# Patient Record
Sex: Female | Born: 1995 | Race: Black or African American | Hispanic: No | Marital: Single | Smoking: Never smoker
Health system: Southern US, Community
[De-identification: ages and names within clinical notes are randomized; demographics above are authoritative.]

## PROBLEM LIST (undated history)

## (undated) HISTORY — PX: TONSILLECTOMY: SUR1361

---

## 2017-12-07 ENCOUNTER — Emergency Department
Admission: EM | Admit: 2017-12-07 | Discharge: 2017-12-07 | Disposition: A | Discharge status: Home / Self Care | Payer: BLUE CROSS/BLUE SHIELD | Source: Home / Self Care | Attending: Family Medicine | Admitting: Family Medicine

## 2017-12-07 ENCOUNTER — Other Ambulatory Visit: Payer: Self-pay

## 2017-12-07 ENCOUNTER — Encounter: Payer: Self-pay | Admitting: Emergency Medicine

## 2017-12-07 ENCOUNTER — Emergency Department: Payer: BLUE CROSS/BLUE SHIELD

## 2017-12-07 DIAGNOSIS — M79662 Pain in left lower leg: Secondary | ICD-10-CM | POA: Diagnosis not present

## 2017-12-07 DIAGNOSIS — M7989 Other specified soft tissue disorders: Secondary | ICD-10-CM

## 2017-12-07 MED ORDER — PREDNISONE 20 MG PO TABS: ORAL_TABLET | ORAL | 0 refills | Status: DC

## 2017-12-07 MED ORDER — DOXYCYCLINE HYCLATE 100 MG PO CAPS: 100.0000 mg | ORAL_CAPSULE | Freq: Two times a day (BID) | ORAL | 0 refills | Status: DC

## 2017-12-07 NOTE — ED Provider Notes (Signed)
[facility]    CSN: [id number] Arrival date & time: 12/07/17  1154     History   Chief Complaint Chief Complaint  Patient presents with  . Joint Swelling    left    HPI Tiffany Joseph is a 22 y.o. female.   Patient stands at work, and reports that she worked a 16 hour shift 48 hours ago.  She noticed increased swelling, pain, and redness in her left medial ankle afterwards.  Yesterday she had persistent pain/swelling that extended to her left distal calf.  No chest pain or shortness of breath.  No fevers, chills, and sweats.  She recalls no injury or insect bite.  No history of DVT.  Her symptoms did not improve after taking ibuprofen.  She reports that she had cellulitis in her right foot in the past that seemed similar to her present symptoms.  She has a family history of gout (grandmother).  The history is provided by the patient.  Leg Pain  Location:  Ankle and leg Time since incident:  2 days Injury: no   Leg location:  L lower leg Ankle location:  L ankle Pain details:    Quality:  Aching   Radiates to:  Does not radiate   Severity:  Moderate   Onset quality:  Sudden   Duration:  2 days   Timing:  Constant   Progression:  Improving Chronicity:  New Prior injury to area:  No Relieved by:  Nothing Worsened by:  Bearing weight Ineffective treatments:  NSAIDs Associated symptoms: stiffness and swelling   Associated symptoms: no decreased ROM, no fatigue, no fever and no tingling     History reviewed. No pertinent past medical history.  There are no active problems to display for this patient.   Past Surgical History:  Procedure Laterality Date  . TONSILLECTOMY      OB History   None      Home Medications    Prior to Admission medications   Medication Sig Start Date End Date Taking? Authorizing Provider  doxycycline (VIBRAMYCIN) 100 MG capsule Take 1 capsule (100 mg total) by mouth 2 (two) times daily. Take with food. 12/07/17   Lattie HawBeese,  Gizella [place] A, MD  predniSONE (DELTASONE) 20 MG tablet Take one tab by mouth twice daily for 4 days, then one daily. Take with food. 12/07/17   Lattie HawBeese, Roshunda Keir A, MD    Family History No family history on file.  Social History Social History   Tobacco Use  . Smoking status: Never Smoker  . Smokeless tobacco: Never Used  Substance Use Topics  . Alcohol use: Never    Frequency: Never  . Drug use: Not on file     Allergies   Penicillins   Review of Systems Review of Systems  Constitutional: Negative for fatigue and fever.  Musculoskeletal: Positive for stiffness.  All other systems reviewed and are negative.    Physical Exam Triage Vital Signs ED Triage Vitals  Enc Vitals Group     BP 12/07/17 1230 108/71     Pulse Rate 12/07/17 1230 91     Resp 12/07/17 1230 16     Temp 12/07/17 1230 99 F (37.2 C)     Temp Source 12/07/17 1230 Oral     SpO2 12/07/17 1230 100 %     Weight 12/07/17 1231 158 lb (71.7 kg)     Height 12/07/17 1231 5\' 3"  (1.6 m)     Head Circumference --  Peak Flow --      Pain Score 12/07/17 1231 3     Pain Loc --      Pain Edu? --      Excl. in [place]? --    No data found.  Updated Vital Signs BP 108/71 (BP Location: Right Arm)   Pulse 91   Temp 99 F (37.2 C) (Oral)   Resp 16   Ht 5\' 3"  (1.6 m)   Wt 158 lb (71.7 kg)   LMP 11/14/2017 (Exact Date)   SpO2 100%   BMI 27.99 kg/m   Visual Acuity Right Eye Distance:   Left Eye Distance:   Bilateral Distance:    Right Eye Near:   Left Eye Near:    Bilateral Near:     Physical Exam  Constitutional: She appears well-developed and well-nourished. No distress.  HENT:  Head: Normocephalic.  Mouth/Throat: Oropharynx is clear and moist.  Eyes: Pupils are equal, round, and reactive to light.  Neck: Neck supple.  Cardiovascular: Normal heart sounds.  Pulmonary/Chest: Breath sounds normal.  Abdominal: There is no tenderness.  Musculoskeletal:       Left ankle: She exhibits swelling. She  exhibits normal range of motion, no ecchymosis, no deformity, no laceration and normal pulse. Tenderness.       Feet:  Left ankle has tenderness, mild swelling, and erythema above the medial malleolus as noted on diagram.  Tenderness extends to the distal calf.  Ankle has full range of motion.  Pedal pulses intact.  Lymphadenopathy:    She has no cervical adenopathy.  Nursing note and vitals reviewed.    UC Treatments / Results  Labs (all labs ordered are listed, but only abnormal results are displayed) Labs Reviewed  URIC ACID  POCT CBC W AUTO DIFF ([facility]):  WBC 7.6; LY 34.1; MO 9.7; GR 56.2; Hgb 12.3; Platelets 224     EKG None  Radiology US Venous Img Lower Unilateral Left  Result Date: 12/07/2017 CLINICAL DATA:  Medial sided ankle pain and swelling for the past 2 days. Evaluate for DVT. EXAM: LEFT LOWER EXTREMITY VENOUS DOPPLER ULTRASOUND TECHNIQUE: Gray-scale sonography with graded compression, as well as color Doppler and duplex ultrasound were performed to evaluate the lower extremity deep venous systems from the level of the common femoral vein and including the common femoral, femoral, profunda femoral, popliteal and calf veins including the posterior tibial, peroneal and gastrocnemius veins when visible. The superficial great saphenous vein was also interrogated. Spectral Doppler was utilized to evaluate flow at rest and with distal augmentation maneuvers in the common femoral, femoral and popliteal veins. COMPARISON:  None. FINDINGS: Contralateral Common Femoral Vein: Respiratory phasicity is normal and symmetric with the symptomatic side. No evidence of thrombus. Normal compressibility. Common Femoral Vein: No evidence of thrombus. Normal compressibility, respiratory phasicity and response to augmentation. Saphenofemoral Junction: No evidence of thrombus. Normal compressibility and flow on color Doppler imaging. Profunda Femoral Vein: No evidence of thrombus. Normal  compressibility and flow on color Doppler imaging. Femoral Vein: No evidence of thrombus. Normal compressibility, respiratory phasicity and response to augmentation. Popliteal Vein: No evidence of thrombus. Normal compressibility, respiratory phasicity and response to augmentation. Calf Veins: No evidence of thrombus. Normal compressibility and flow on color Doppler imaging. Superficial Great Saphenous Vein: No evidence of thrombus. Normal compressibility. Venous Reflux:  None. Other Findings:  None. IMPRESSION: No evidence of DVT within the left lower extremity. Electronically Signed   By: Simonne Come M.D.   On: 12/07/2017  14:28    Procedures Procedures (including critical care time)  Medications Ordered in UC Medications - No data to display  Initial Impression / Assessment and Plan / UC Course  I have reviewed the triage vital signs and the nursing notes.  Pertinent labs & imaging results that were available during my care of the patient were reviewed by me and considered in my medical decision making (see chart for details).    No evidence DVT.  Note normal WBC (7.6) ?gout vs cellulitis. Uric acid pending. Begin empiric doxycycline and prednisone burst/taper. Followup with Family Doctor if not improved in one week.     Final Clinical Impressions(s) / UC Diagnoses   Final diagnoses:  Pain and swelling of left lower leg   Discharge Instructions   None    ED Prescriptions    Medication Sig Dispense Auth. Provider   doxycycline (VIBRAMYCIN) 100 MG capsule Take 1 capsule (100 mg total) by mouth 2 (two) times daily. Take with food. 14 capsule Lattie Haw, MD   predniSONE (DELTASONE) 20 MG tablet Take one tab by mouth twice daily for 4 days, then one daily. Take with food. 12 tablet Lattie Haw, MD         Lattie Haw, MD 12/09/17 [phone]

## 2017-12-07 NOTE — ED Triage Notes (Signed)
Patient worked long hours over past 48 hours and noticed progressive edema and discomfort on inner aspect of left edema; no known injury or insect bite; tried ice/elevation and anti-inflammatories.

## 2017-12-08 ENCOUNTER — Telehealth: Payer: Self-pay

## 2017-12-08 LAB — URIC ACID: Uric Acid, Serum: 4.4 mg/dL (ref 2.5–7.0)

## 2017-12-08 NOTE — Telephone Encounter (Signed)
Spoke with patient, feeling better.  She asked if not feeling better by Saturday, could she have a work note, I explained that we could give a note for date seen, but not for 6 days after being seen.  Lab results given

## 2017-12-28 LAB — POCT CBC W AUTO DIFF (K'VILLE URGENT CARE)

## 2019-02-26 ENCOUNTER — Other Ambulatory Visit: Payer: Self-pay

## 2019-02-26 DIAGNOSIS — Z20822 Contact with and (suspected) exposure to covid-19: Secondary | ICD-10-CM

## 2019-02-27 LAB — NOVEL CORONAVIRUS, NAA: SARS-CoV-2, NAA: NOT DETECTED

## 2019-05-02 IMAGING — US US EXTREM LOW VENOUS*L*
1 series · 13 of 24 positions shown · non-contrast
Comparison: None.

CLINICAL DATA: Medial sided ankle pain and swelling for the past 2
days. Evaluate for DVT.



[Series 1: us extrem low venous*left* · 13 of 28 slices shown]
[im 1/28]
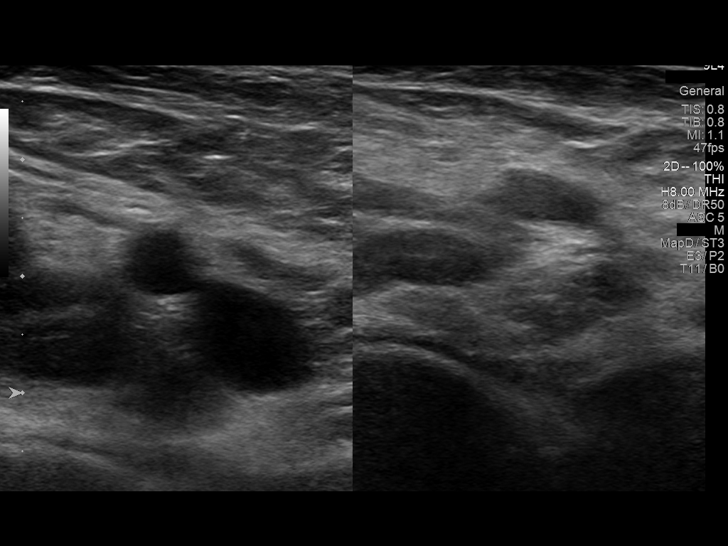
[im 3/28]
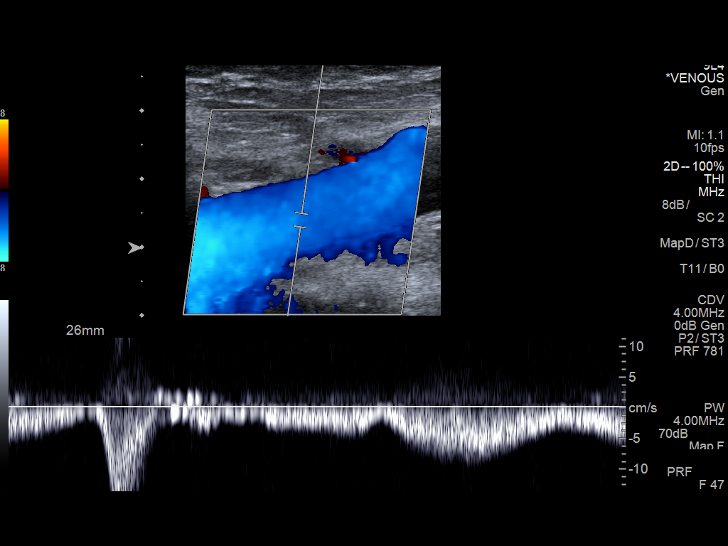
[im 5/28]
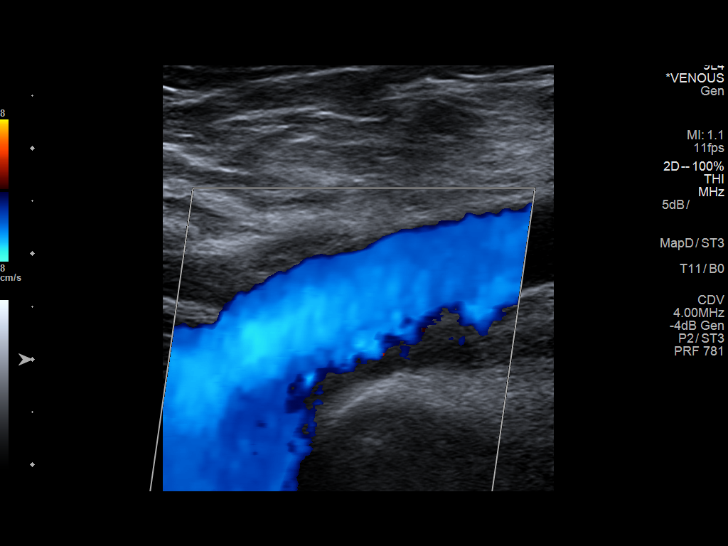
[im 8/28]
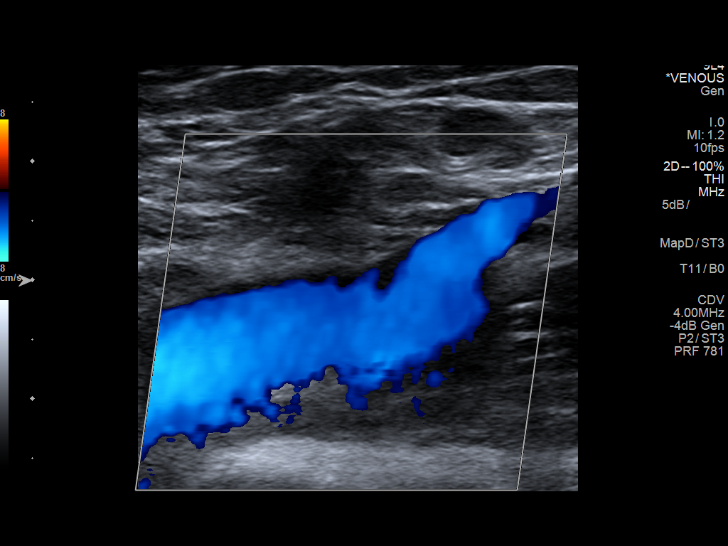
[im 10/28]
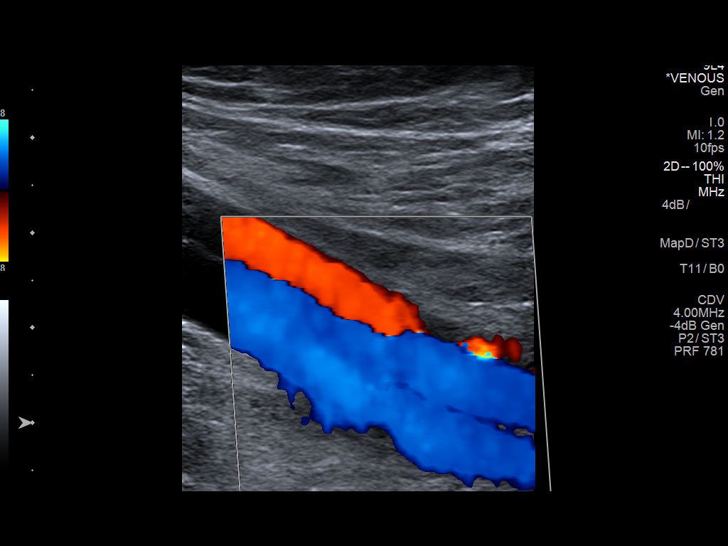
[im 12/28]
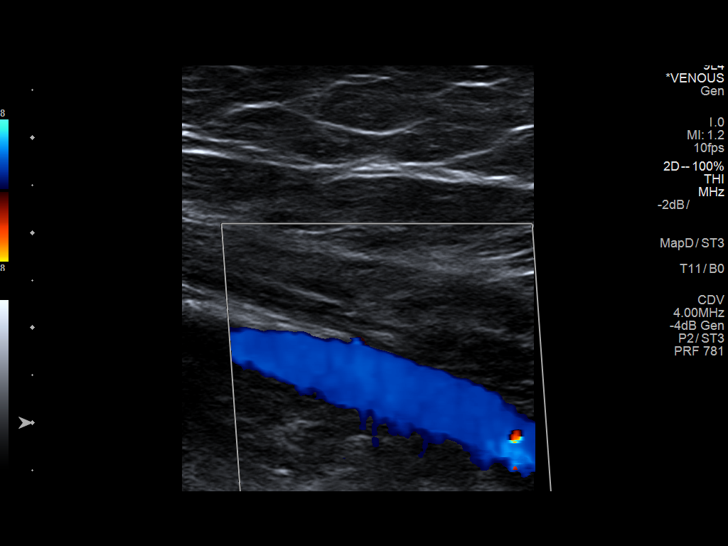
[im 15/28]
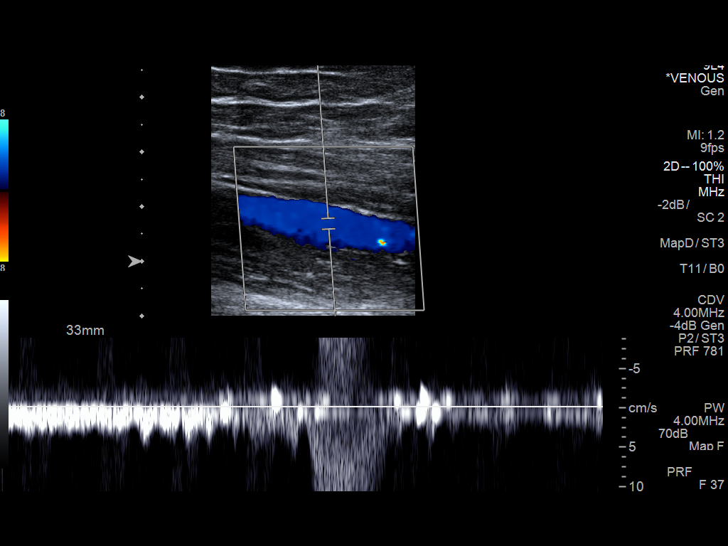
[im 16/28]
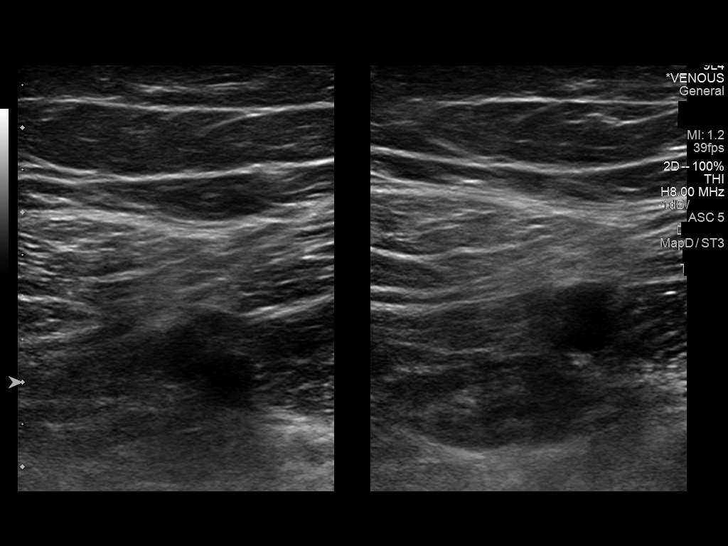
[im 18/28]
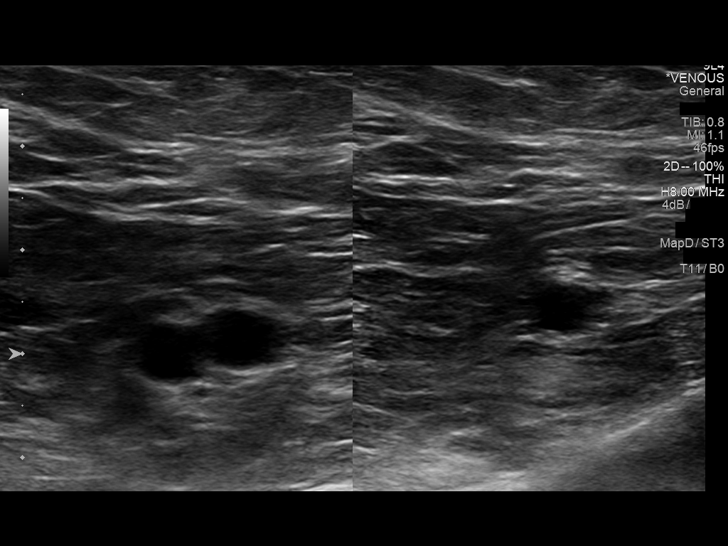
[im 20/28]
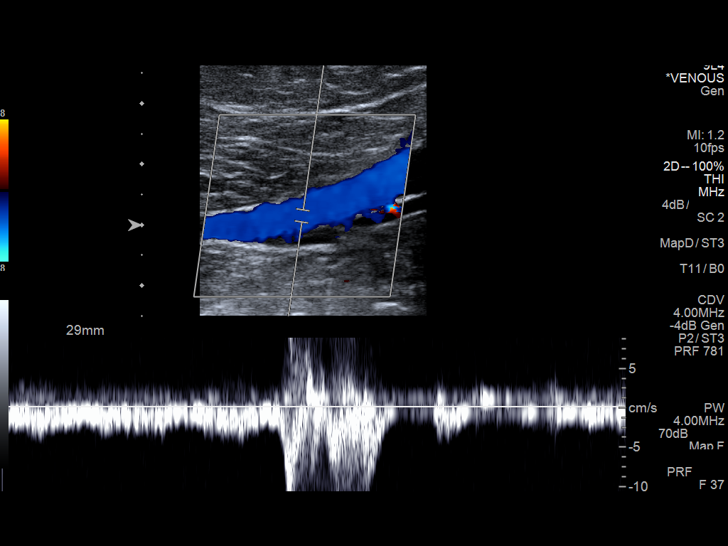
[im 23/28]
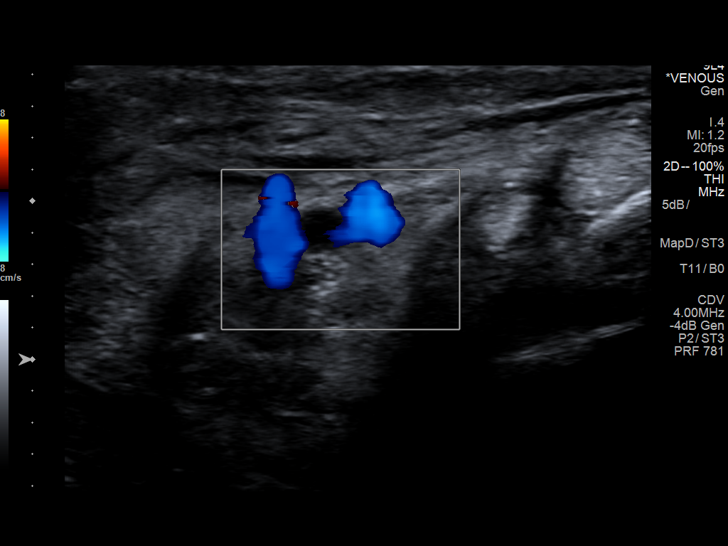
[im 25/28]
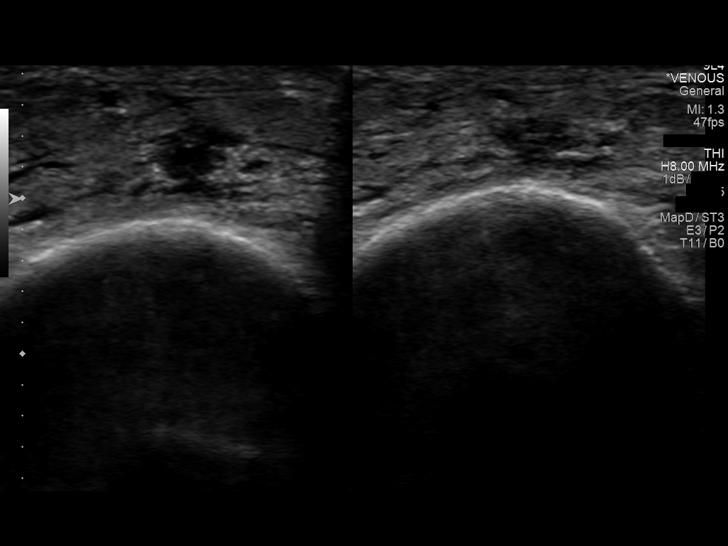
[im 28/28]
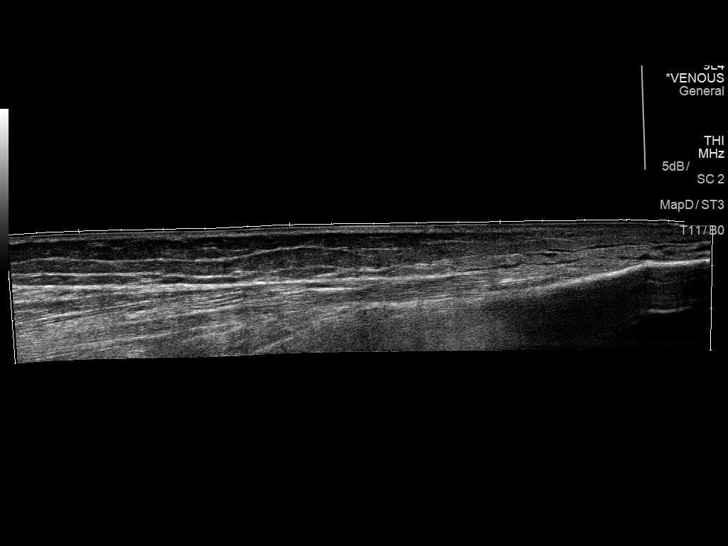

[13 of 24 positions shown; findings below may reference images not displayed]

FINDINGS: Contralateral Common Femoral Vein: Respiratory phasicity is normal
and symmetric with the symptomatic side. No evidence of thrombus.
Normal compressibility.

Common Femoral Vein: No evidence of thrombus. Normal
compressibility, respiratory phasicity and response to augmentation.

Saphenofemoral Junction: No evidence of thrombus. Normal
compressibility and flow on color Doppler imaging.

Profunda Femoral Vein: No evidence of thrombus. Normal
compressibility and flow on color Doppler imaging.

Femoral Vein: No evidence of thrombus. Normal compressibility,
respiratory phasicity and response to augmentation.

Popliteal Vein: No evidence of thrombus. Normal compressibility,
respiratory phasicity and response to augmentation.

Calf Veins: No evidence of thrombus. Normal compressibility and flow
on color Doppler imaging.

Superficial Great Saphenous Vein: No evidence of thrombus. Normal
compressibility.

Venous Reflux:  None.

Other Findings:  None.
IMPRESSION: No evidence of DVT within the left lower extremity.

## 2019-12-25 ENCOUNTER — Ambulatory Visit (HOSPITAL_COMMUNITY)
Admission: EM | Admit: 2019-12-25 | Discharge: 2019-12-25 | Disposition: A | Discharge status: Home / Self Care | Attending: Family Medicine | Admitting: Family Medicine

## 2019-12-25 ENCOUNTER — Encounter (HOSPITAL_COMMUNITY): Payer: Self-pay

## 2019-12-25 ENCOUNTER — Other Ambulatory Visit: Payer: Self-pay

## 2019-12-25 DIAGNOSIS — N898 Other specified noninflammatory disorders of vagina: Secondary | ICD-10-CM | POA: Diagnosis present

## 2019-12-25 DIAGNOSIS — Z3202 Encounter for pregnancy test, result negative: Secondary | ICD-10-CM

## 2019-12-25 LAB — POC URINE PREG, ED: Preg Test, Ur: NEGATIVE

## 2019-12-25 MED ORDER — FLUCONAZOLE 150 MG PO TABS: 150.0000 mg | ORAL_TABLET | Freq: Once | ORAL | 0 refills | Status: AC

## 2019-12-25 NOTE — Discharge Instructions (Signed)
Take 1 tab diflucan, repeat in 3-4 days if yeast positive and still having symptoms  We are testing you for Gonorrhea, Chlamydia, Trichomonas, Yeast and Bacterial Vaginosis. We will call you if anything is positive and let you know if you require any further treatment. Please inform partners of any positive results.   Please return if symptoms not improving with treatment, development of fever, nausea, vomiting, abdominal pain.

## 2019-12-25 NOTE — ED Notes (Signed)
Negative pregnancy test

## 2019-12-25 NOTE — ED Triage Notes (Signed)
Pt c/o a large amount of white, thick cottage cheese dischargex4 days. Pt denies any other symptoms.

## 2019-12-25 NOTE — ED Provider Notes (Signed)
[facility]    CSN: [id number] Arrival date & time: 12/25/19  1006      History   Chief Complaint Chief Complaint  Patient presents with  . vaginal discharge    HPI Tiffany Joseph is a 24 y.o. female presenting today for evaluation of vaginal discharge.  Patient reports that over the past 4 days she has had increased amount of discharge which she reports is thicker than normal.  She denies other symptoms of itching irritation or burning.  Denies urinary symptoms of dysuria, increased frequency urgency, hematuria.  Denies any pelvic pain.  Last menstrual cycle was around 06/1.  Is not on any form of birth control.  Does report history of yeast and BV, but more significantly yeast.  Typically will have itching associated with the symptoms.  HPI  History reviewed. No pertinent past medical history.  There are no problems to display for this patient.   Past Surgical History:  Procedure Laterality Date  . TONSILLECTOMY      OB History   No obstetric history on file.      Home Medications    Prior to Admission medications   Medication Sig Start Date End Date Taking? Authorizing Provider  fluconazole (DIFLUCAN) 150 MG tablet Take 1 tablet (150 mg total) by mouth once for 1 dose. 12/25/19 12/25/19  Jermiya Reichl, Elesa Hacker, PA-C    Family History No family history on file.  Social History Social History   Tobacco Use  . Smoking status: Never Smoker  . Smokeless tobacco: Never Used  Substance Use Topics  . Alcohol use: Never  . Drug use: Never     Allergies   Penicillins   Review of Systems Review of Systems  Constitutional: Negative for fever.  Respiratory: Negative for shortness of breath.   Cardiovascular: Negative for chest pain.  Gastrointestinal: Negative for abdominal pain, diarrhea, nausea and vomiting.  Genitourinary: Positive for vaginal discharge. Negative for dysuria, flank pain, genital sores, hematuria, menstrual problem, vaginal bleeding  and vaginal pain.  Musculoskeletal: Negative for back pain.  Skin: Negative for rash.  Neurological: Negative for dizziness, light-headedness and headaches.     Physical Exam Triage Vital Signs ED Triage Vitals  Enc Vitals Group     BP      Pulse      Resp      Temp      Temp src      SpO2      Weight      Height      Head Circumference      Peak Flow      Pain Score      Pain Loc      Pain Edu?      Excl. in [place]?    No data found.  Updated Vital Signs BP 111/69   Pulse 76   Temp 98.6 F (37 C) (Oral)   Resp 16   Ht 5\' 4"  (1.626 m)   Wt 140 lb (63.5 kg)   BMI 24.03 kg/m   Visual Acuity Right Eye Distance:   Left Eye Distance:   Bilateral Distance:    Right Eye Near:   Left Eye Near:    Bilateral Near:     Physical Exam Vitals and nursing note reviewed.  Constitutional:      Appearance: She is well-developed.     Comments: No acute distress  HENT:     Head: Normocephalic and atraumatic.     Nose: Nose normal.  Eyes:  Conjunctiva/sclera: Conjunctivae normal.  Cardiovascular:     Rate and Rhythm: Normal rate.  Pulmonary:     Effort: Pulmonary effort is normal. No respiratory distress.  Abdominal:     General: There is no distension.  Musculoskeletal:        General: Normal range of motion.     Cervical back: Neck supple.  Skin:    General: Skin is warm and dry.  Neurological:     Mental Status: She is alert and oriented to person, place, and time.      UC Treatments / Results  Labs (all labs ordered are listed, but only abnormal results are displayed) Labs Reviewed  POC URINE PREG, ED  CERVICOVAGINAL ANCILLARY ONLY    EKG   Radiology No results found.  Procedures Procedures (including critical care time)  Medications Ordered in UC Medications - No data to display  Initial Impression / Assessment and Plan / UC Course  I have reviewed the triage vital signs and the nursing notes.  Pertinent labs & imaging results that were  available during my care of the patient were reviewed by me and considered in my medical decision making (see chart for details).     Empirically treating for yeast with Diflucan, 1 tab today, 1 tab in 2 to 3 days if still persistent symptoms.  Vaginal swab pending to evaluate for BV/yeast/STDs.  Will call with results and alter treatment as needed.  Pregnancy test negative.  Discussed strict return precautions. Patient verbalized understanding and is agreeable with plan.  Final Clinical Impressions(s) / UC Diagnoses   Final diagnoses:  Vaginal discharge     Discharge Instructions     Take 1 tab diflucan, repeat in 3-4 days if yeast positive and still having symptoms  We are testing you for Gonorrhea, Chlamydia, Trichomonas, Yeast and Bacterial Vaginosis. We will call you if anything is positive and let you know if you require any further treatment. Please inform partners of any positive results.   Please return if symptoms not improving with treatment, development of fever, nausea, vomiting, abdominal pain.     ED Prescriptions    Medication Sig Dispense Auth. Provider   fluconazole (DIFLUCAN) 150 MG tablet Take 1 tablet (150 mg total) by mouth once for 1 dose. 2 tablet Kaizen Ibsen, Pyote C, PA-C     PDMP not reviewed this encounter.   Lew Dawes, [place] 12/25/19 2216

## 2019-12-26 LAB — CERVICOVAGINAL ANCILLARY ONLY
Bacterial Vaginitis (gardnerella): NEGATIVE
Candida Glabrata: NEGATIVE
Candida Vaginitis: NEGATIVE
Chlamydia: NEGATIVE
Comment: NEGATIVE
Comment: NEGATIVE
Comment: NEGATIVE
Comment: NEGATIVE
Comment: NEGATIVE
Comment: NORMAL
Neisseria Gonorrhea: NEGATIVE
Trichomonas: NEGATIVE

## 2020-10-29 ENCOUNTER — Other Ambulatory Visit: Payer: Self-pay | Admitting: Nurse Practitioner

## 2020-10-29 DIAGNOSIS — N6311 Unspecified lump in the right breast, upper outer quadrant: Secondary | ICD-10-CM

## 2020-11-13 ENCOUNTER — Ambulatory Visit
Admission: RE | Admit: 2020-11-13 | Discharge: 2020-11-13 | Disposition: A | Discharge status: Home / Self Care | Source: Ambulatory Visit | Attending: Nurse Practitioner | Admitting: Nurse Practitioner

## 2020-11-13 ENCOUNTER — Other Ambulatory Visit: Payer: Self-pay | Admitting: Nurse Practitioner

## 2020-11-13 ENCOUNTER — Other Ambulatory Visit: Payer: Self-pay

## 2020-11-13 DIAGNOSIS — N6311 Unspecified lump in the right breast, upper outer quadrant: Secondary | ICD-10-CM

## 2021-05-22 ENCOUNTER — Other Ambulatory Visit: Payer: Self-pay | Admitting: Nurse Practitioner

## 2021-05-22 ENCOUNTER — Ambulatory Visit
Admission: RE | Admit: 2021-05-22 | Discharge: 2021-05-22 | Disposition: A | Discharge status: Home / Self Care | Source: Ambulatory Visit | Attending: Nurse Practitioner | Admitting: Nurse Practitioner

## 2021-05-22 DIAGNOSIS — N6311 Unspecified lump in the right breast, upper outer quadrant: Secondary | ICD-10-CM

## 2021-05-22 DIAGNOSIS — N63 Unspecified lump in unspecified breast: Secondary | ICD-10-CM

## 2021-11-21 ENCOUNTER — Other Ambulatory Visit

## 2021-11-26 IMAGING — US US BREAST*R* LIMITED INC AXILLA
1 series · 14 of 19 positions shown · non-contrast
Comparison: None.

CLINICAL DATA: Palpable lump in the right breast felt by the
patient's physician.

EXAM:
ULTRASOUND OF THE RIGHT BREAST

[Series 1: us breast*right* limited inc axilla · 0.06mm/px · 14 of 19 slices shown]
[im 1/19]
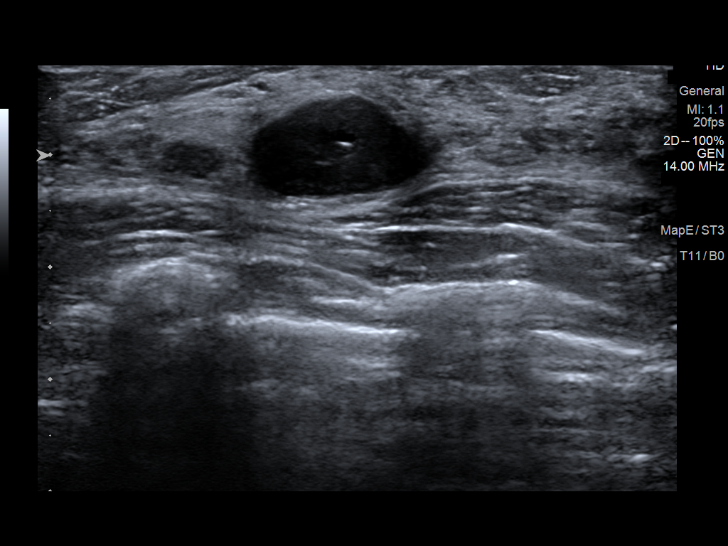
[im 3/19]
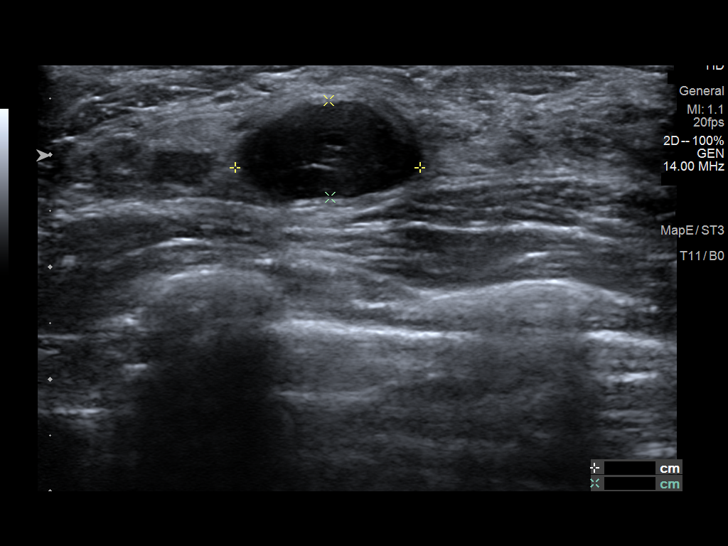
[im 4/19]
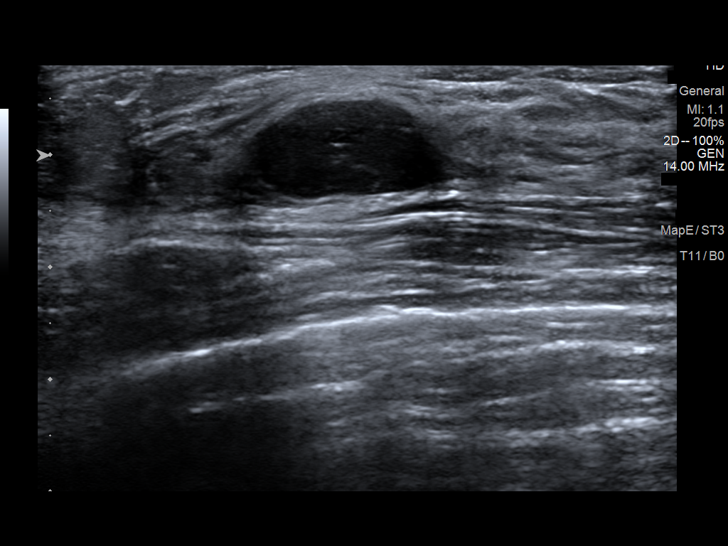
[im 5/19]
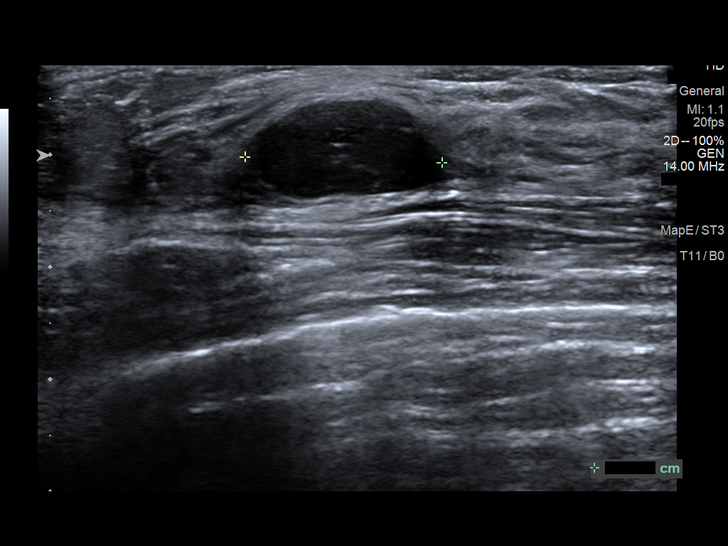
[im 7/19]
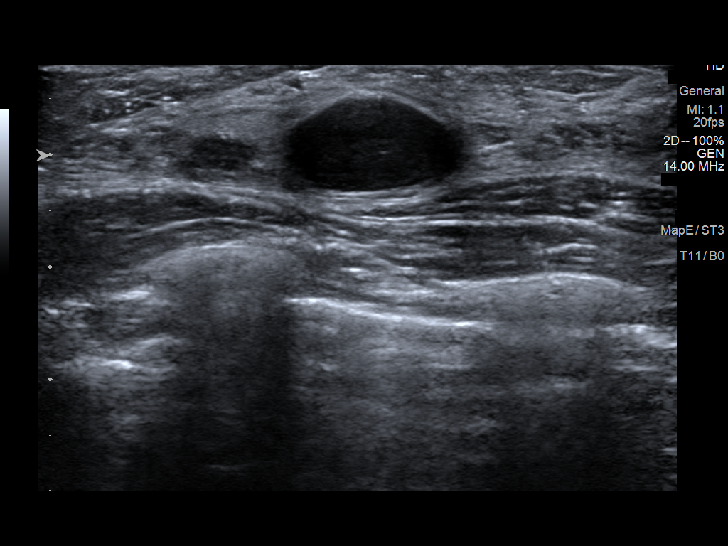
[im 8/19]
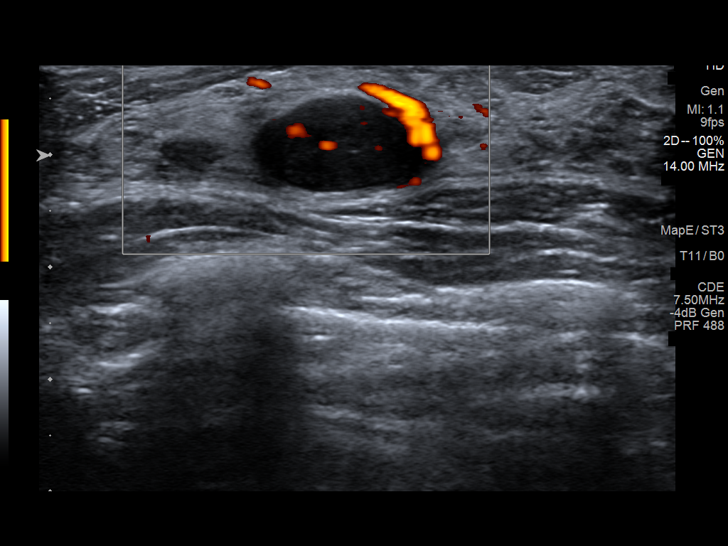
[im 9/19]
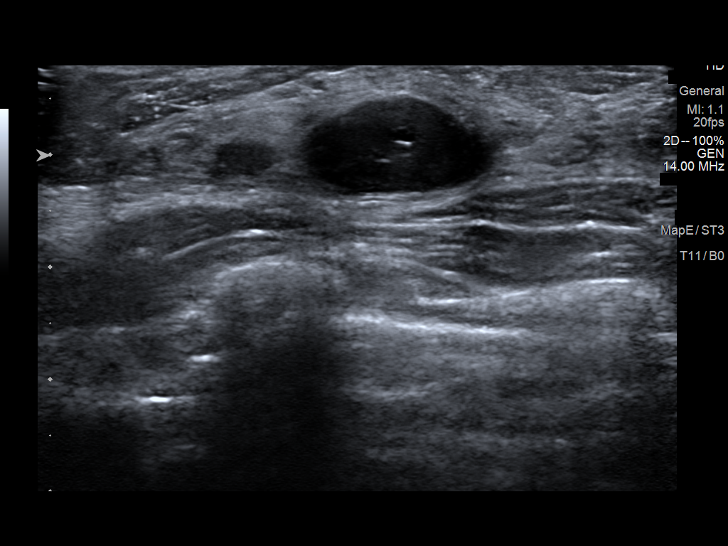
[im 11/19]
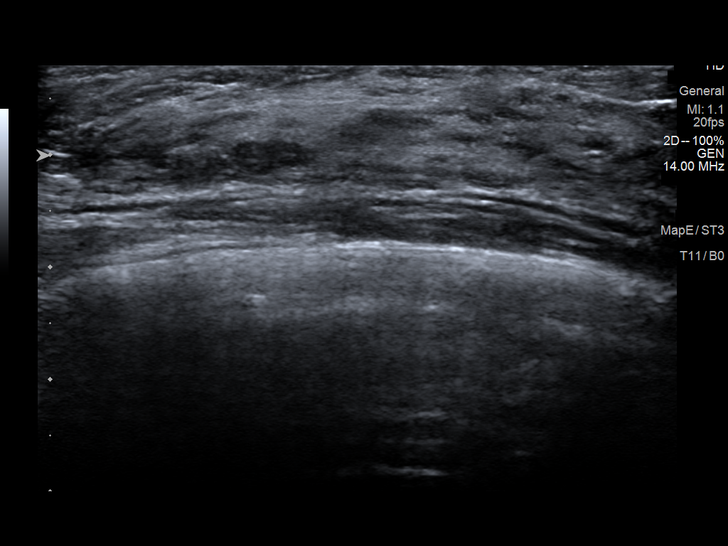
[im 12/19]
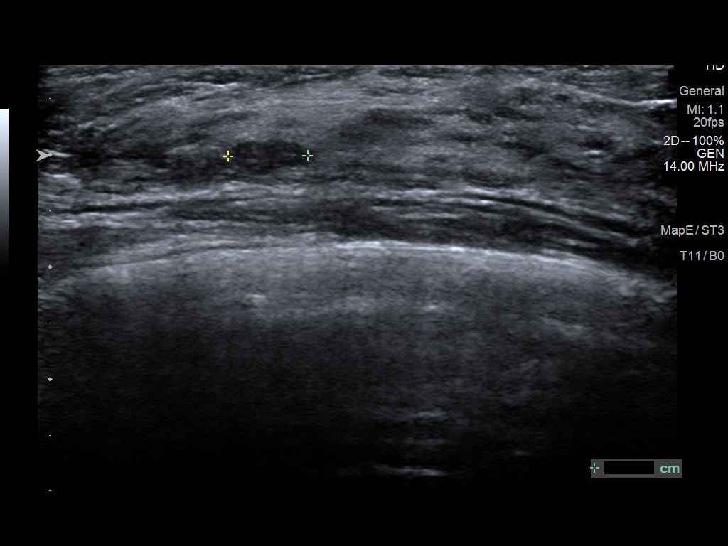
[im 13/19]
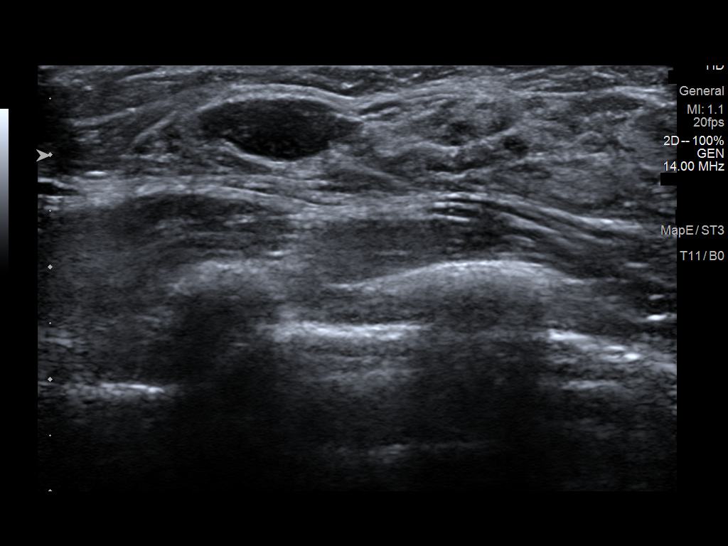
[im 15/19]
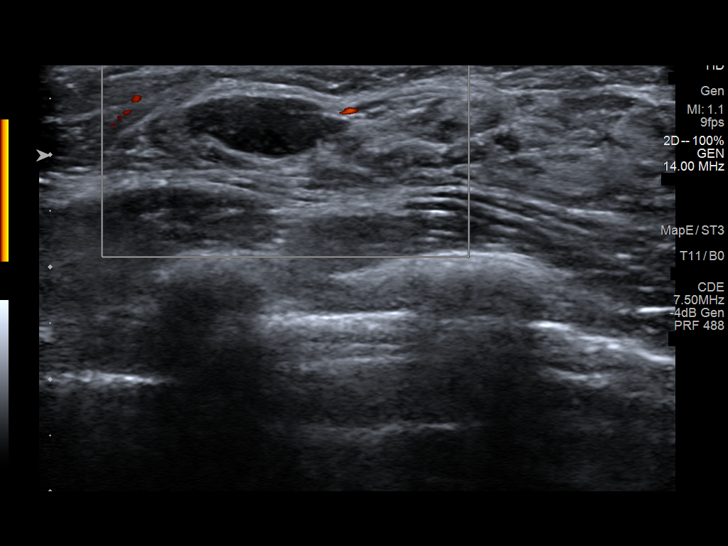
[im 16/19]
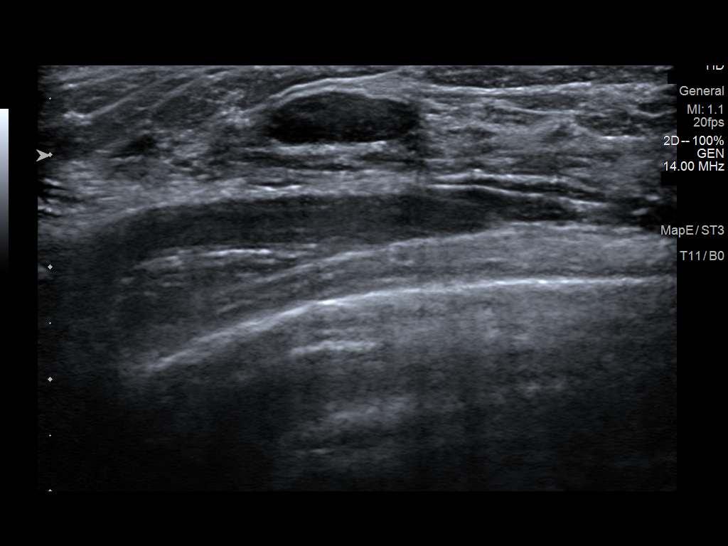
[im 17/19]
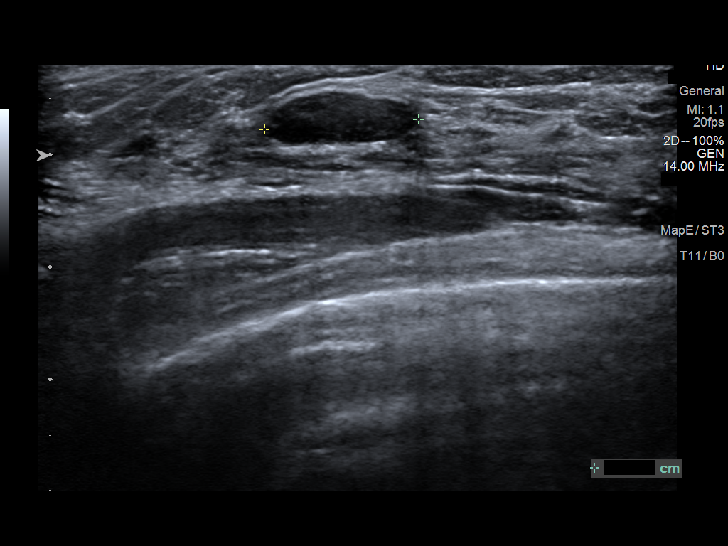
[im 19/19]
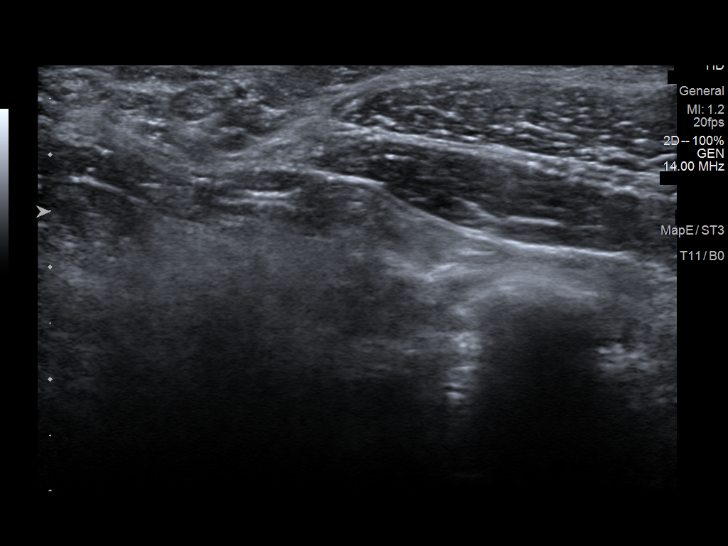

[14 of 19 positions shown; findings below may reference images not displayed]

FINDINGS: On physical exam, palpable lump is felt at 10 o'clock in the right
breast.

Targeted ultrasound is performed, showing a hypoechoic mass with
parallel orientation and increased through transmission in the right
breast at 10 o'clock 4 cm from the nipple measuring 17 x 9 x 18 mm.
There is an adjacent similar appearing mass at 10 o'clock, 4 cm from
the nipple measuring 6 x 3 x 7 mm. A final similar appearing masses
seen at [DATE], 1 cm from the nipple measuring 14 x 6 x 14 mm.
IMPRESSION: Three probably benign masses in the right breast as above.

RECOMMENDATION:
Recommend six-month follow-up ultrasound of the probably benign
right breast masses.

I have discussed the findings and recommendations with the patient.
If applicable, a reminder letter will be sent to the patient
regarding the next appointment.

BI-RADS CATEGORY  3: Probably benign.

## 2022-06-04 IMAGING — US US BREAST*R* LIMITED INC AXILLA
1 series · 14 of 15 positions shown · non-contrast
Comparison: 11/13/2020

CLINICAL DATA: First six-month follow-up for probably benign RIGHT
breast masses.

EXAM:
ULTRASOUND OF THE RIGHT BREAST

[Series 1: us breast*right* limited inc axilla · 0.06mm/px · 14 of 15 slices shown]
[im 1/15]
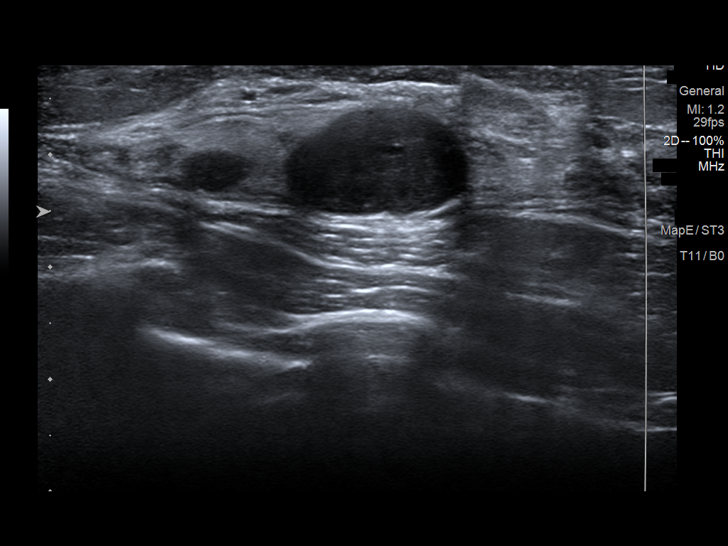
[im 2/15]
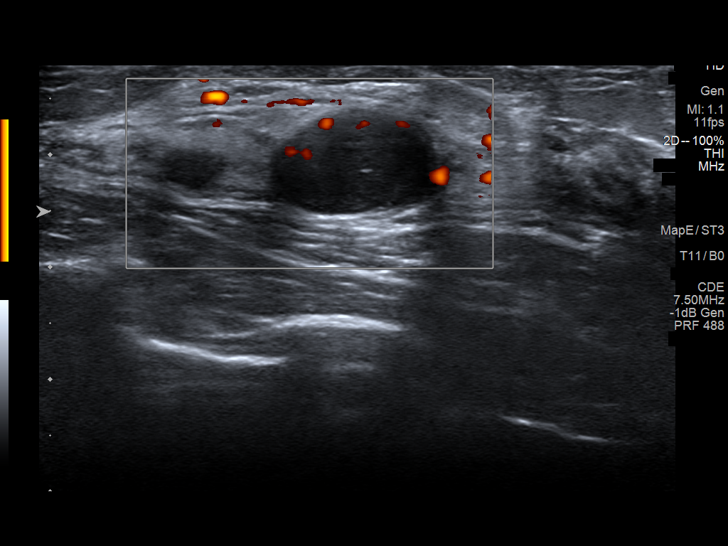
[im 3/15]
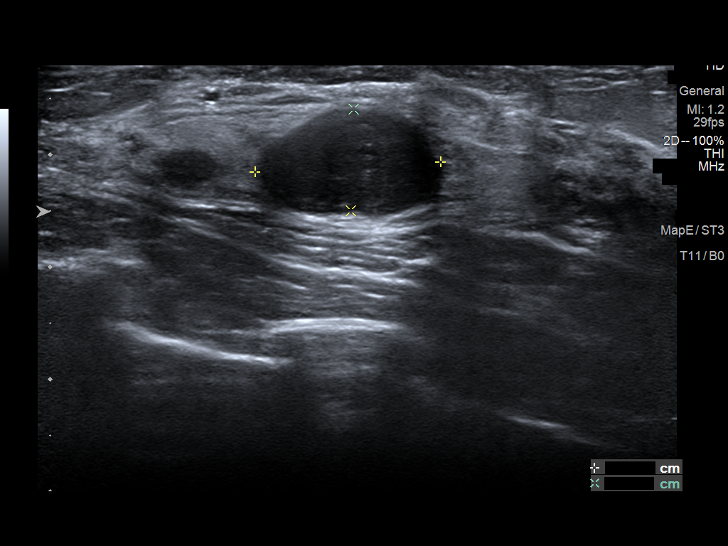
[im 4/15]
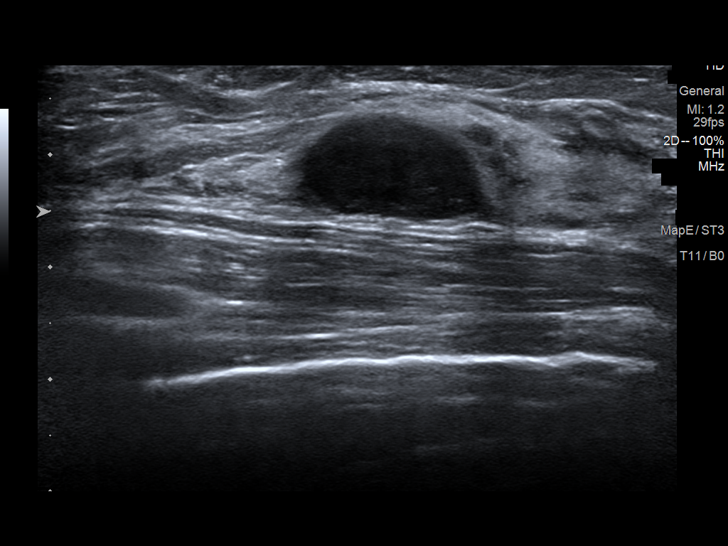
[im 5/15]
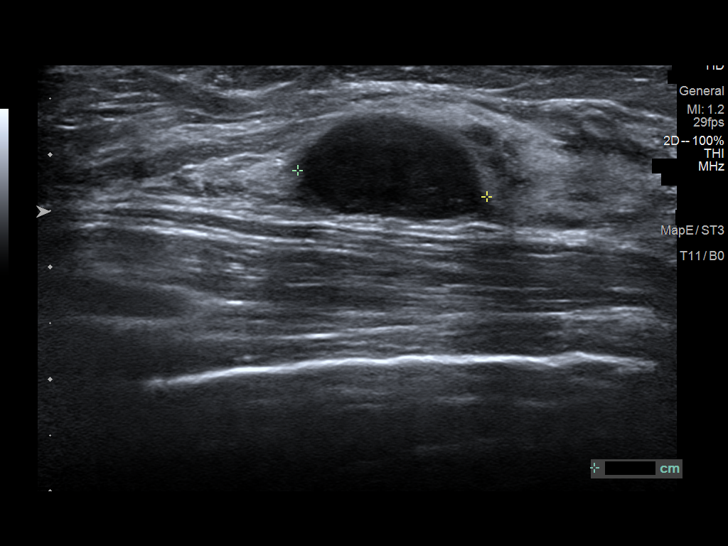
[im 6/15]
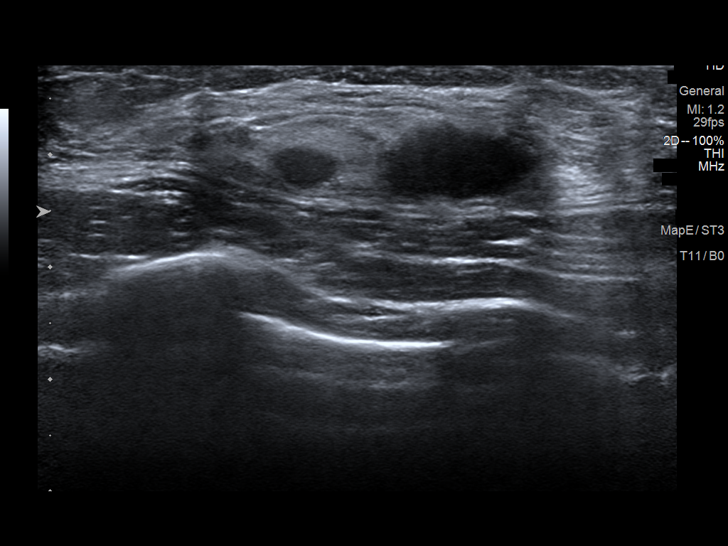
[im 7/15]
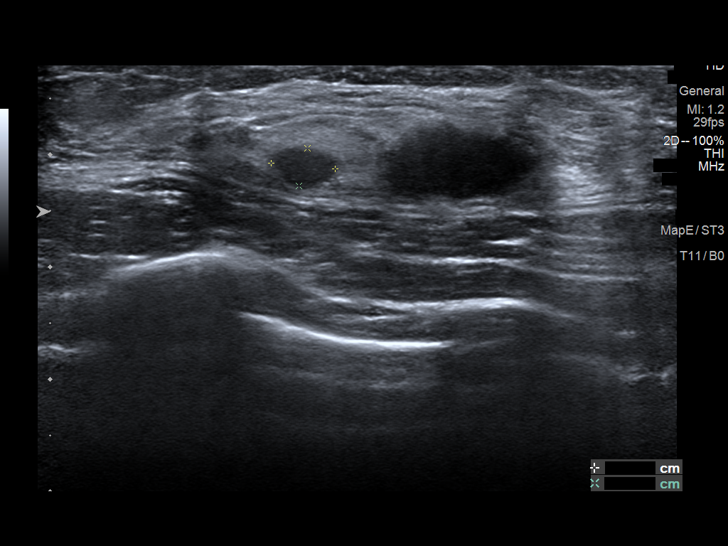
[im 9/15]
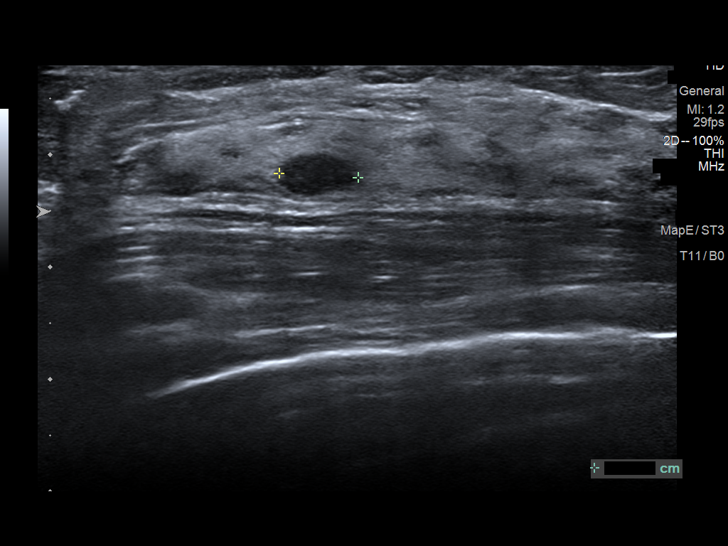
[im 10/15]
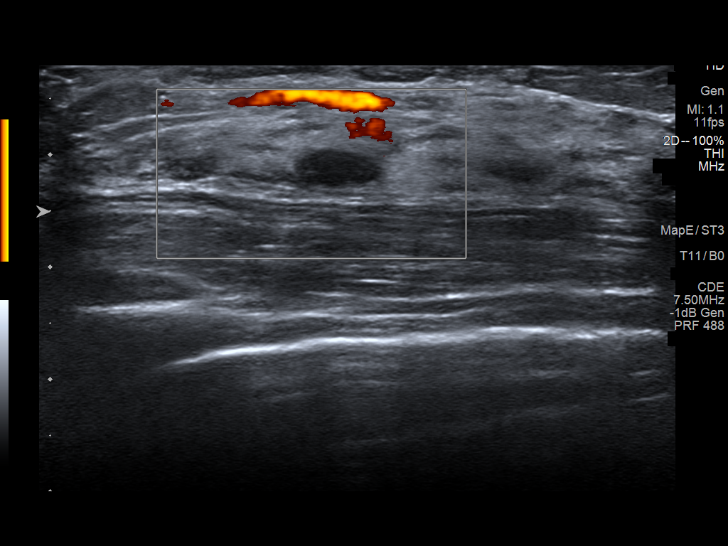
[im 11/15]
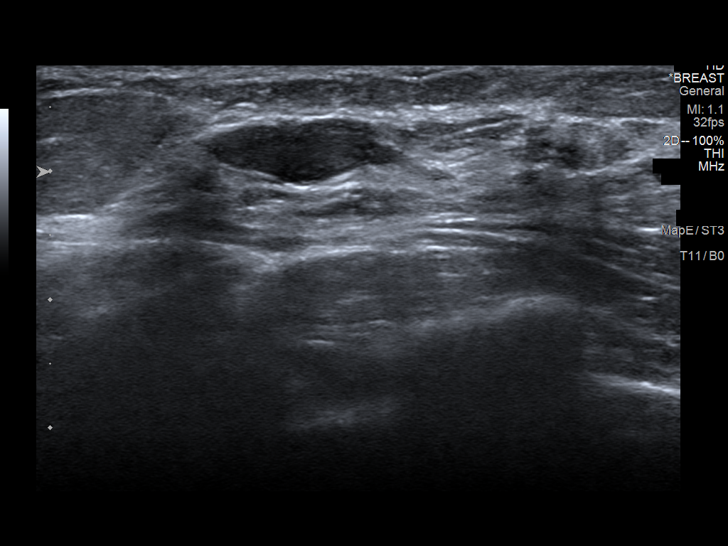
[im 12/15]
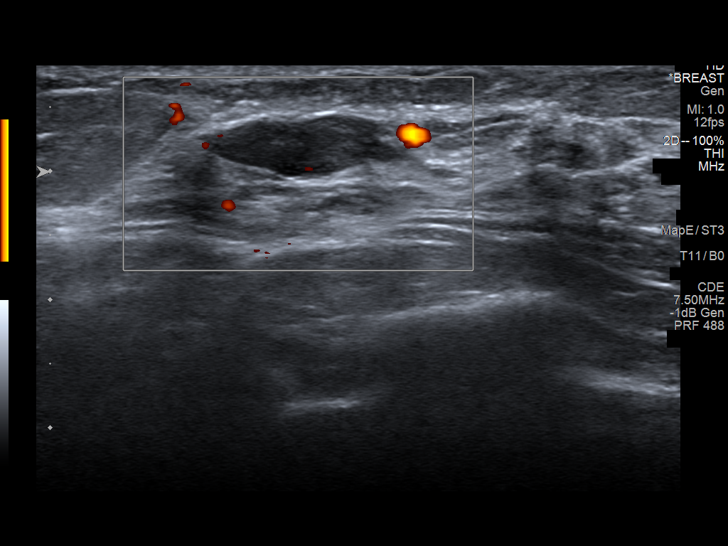
[im 13/15]
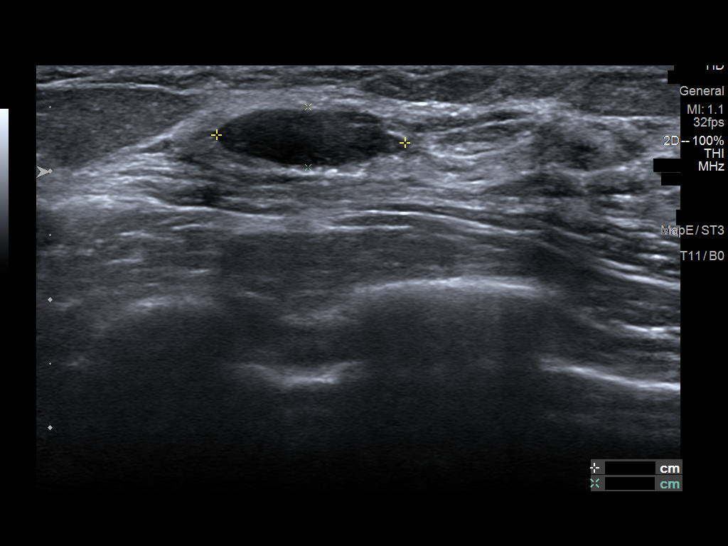
[im 14/15]
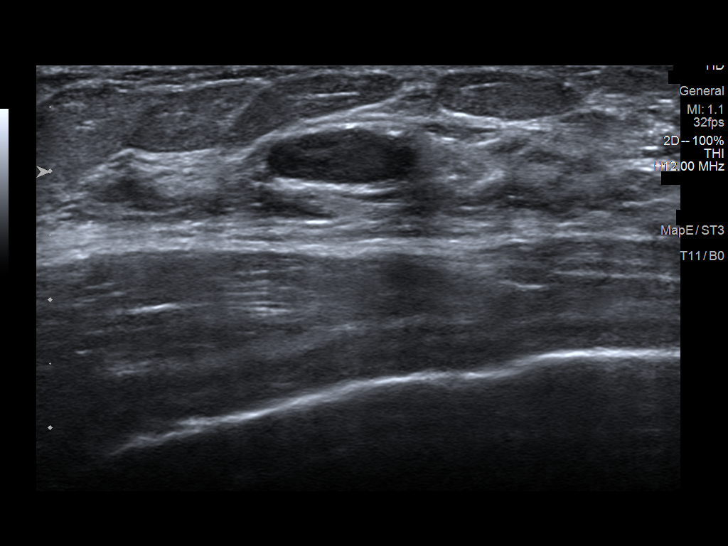
[im 15/15]
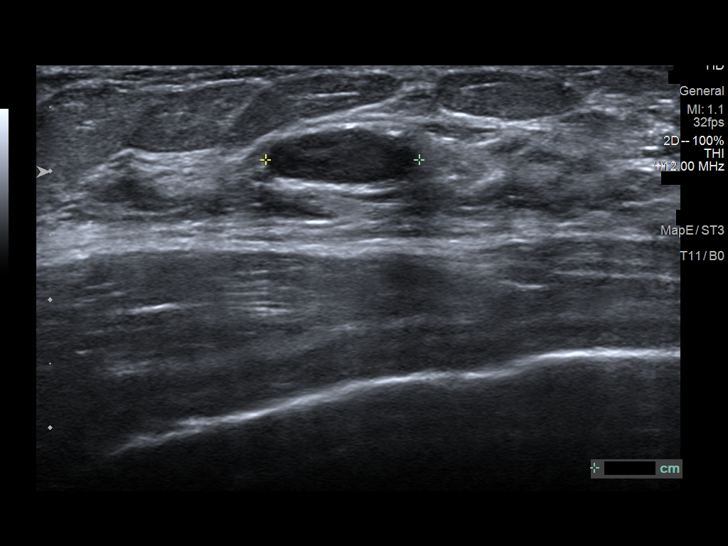

[14 of 15 positions shown; findings below may reference images not displayed]

FINDINGS: Targeted ultrasound is performed, showing a circumscribed oval
hypoechoic parallel mass with posterior acoustic enhancement in the
10 o'clock location of the RIGHT breast 4 centimeters from the
nipple measuring 1.7 x 0.9 x 1.7 centimeters. An adjacent similar
appearing mass is 0.6 x 0.3 x 0.7 centimeters.

In the 9:30 o'clock location 1 centimeter from the nipple, an oval
circumscribed hypoechoic parallel mass is 1.5 x 0.5 x
centimeters. No suspicious mass identified in this region.
IMPRESSION: Stable probable fibroadenomas in the LATERAL aspect of the RIGHT
breast.

RECOMMENDATION:
Recommend RIGHT breast ultrasound in 6 months to assess stability.

I have discussed the findings and recommendations with the patient.
If applicable, a reminder letter will be sent to the patient
regarding the next appointment.

BI-RADS CATEGORY  3: Probably benign.
